# Patient Record
Sex: Male | Born: 1995 | Race: Black or African American | Hispanic: No | Marital: Single | Smoking: Current some day smoker
Health system: Southern US, Community
[De-identification: ages and names within clinical notes are randomized; demographics above are authoritative.]

---

## 2017-04-15 ENCOUNTER — Emergency Department (HOSPITAL_COMMUNITY): Payer: Self-pay

## 2017-04-15 ENCOUNTER — Encounter (HOSPITAL_COMMUNITY): Payer: Self-pay

## 2017-04-15 ENCOUNTER — Emergency Department (HOSPITAL_COMMUNITY)
Admission: EM | Admit: 2017-04-15 | Discharge: 2017-04-15 | Disposition: A | Discharge status: Home / Self Care | Payer: Self-pay | Attending: Emergency Medicine | Admitting: Emergency Medicine

## 2017-04-15 DIAGNOSIS — F17200 Nicotine dependence, unspecified, uncomplicated: Secondary | ICD-10-CM | POA: Insufficient documentation

## 2017-04-15 DIAGNOSIS — M25562 Pain in left knee: Secondary | ICD-10-CM

## 2017-04-15 NOTE — ED Triage Notes (Signed)
Pt complaining of pain in back of L knee. Pt denies any injury/trauma. Pt denies any swelling. Pt ambulatory at triage.

## 2017-04-15 NOTE — ED Provider Notes (Signed)
[facility]-EMERGENCY DEPT Provider Note   CSN: [id number] Arrival date & time: 04/15/17  0137  By signing my name below, I, Daniel Hahn, attest that this documentation has been prepared under the direction and in the presence of Daniel Booze, MD. Electronically Signed: Cynda Hahn, Scribe. 04/15/17. 3:28 AM.  History   Chief Complaint Chief Complaint  Patient presents with  . Knee Pain    HPI Comments: Daniel Hahn is a 21 y.o. male with no pertinent past medical history, who presents to the Emergency Department complaining of sudden-onset, constant left knee pain that began earlier 8 days ago. Patient states he woke up with pain in his left leg 8 days ago. Patient has a knee brace applied in the room, he states he feels "something moving" in his knee when he takes the brace off. Patient reports starting two new jobs. Patient denies any injury or trauma to the area. Patient states his pain is worse with bending, nothing improves his pain. Patient is ambulatory in the emergency department. Patient denies any numbness, weakness, fever, or chills.   The history is provided by the patient. No language interpreter was used.    History reviewed. No pertinent past medical history.  There are no active problems to display for this patient.   History reviewed. No pertinent surgical history.     Home Medications    Prior to Admission medications   Not on File    Family History History reviewed. No pertinent family history.  Social History Social History  Substance Use Topics  . Smoking status: Current Some Day Smoker  . Smokeless tobacco: Never Used  . Alcohol use Yes     Allergies   Patient has no allergy information on record.   Review of Systems Review of Systems  Constitutional: Negative for chills and fever.  Musculoskeletal: Positive for arthralgias (left knee). Negative for gait problem and joint swelling.  Neurological: Negative for weakness and numbness.  All  other systems reviewed and are negative.    Physical Exam Updated Vital Signs BP (!) 130/102   Pulse 81   Temp 98.4 F (36.9 C) (Oral)   Resp 16   SpO2 100%   Physical Exam  Constitutional: He is oriented to person, place, and time. He appears well-developed and well-nourished.  HENT:  Head: Normocephalic and atraumatic.  Eyes: EOM are normal. Pupils are equal, round, and reactive to light.  Neck: Normal range of motion. Neck supple. No JVD present.  Cardiovascular: Normal rate, regular rhythm and normal heart sounds.   No murmur heard. Pulmonary/Chest: Effort normal and breath sounds normal. He has no wheezes. He has no rales. He exhibits no tenderness.  Abdominal: Soft. Bowel sounds are normal. He exhibits no distension and no mass. There is no tenderness.  Musculoskeletal: Normal range of motion. He exhibits no edema, tenderness or deformity.  No instability, swelling, or effusion in the left knee. 1 cm nodule present on the posterior aspect of the knee medially. Mild pain at the extremes with range of motion. Lachman and McMurray's tests are negative.   Lymphadenopathy:    He has no cervical adenopathy.  Neurological: He is alert and oriented to person, place, and time. No cranial nerve deficit. He exhibits normal muscle tone. Coordination normal.  Skin: Skin is warm and dry. No rash noted.  Psychiatric: He has a normal mood and affect. His behavior is normal. Judgment and thought content normal.  Nursing note and vitals reviewed.    ED Treatments / Results  DIAGNOSTIC STUDIES: Oxygen Saturation is 100% on RA, normal by my interpretation.    COORDINATION OF CARE: 3:27 AM Discussed treatment plan with pt at bedside and pt agreed to plan, which includes an x-ray, naproxen, and an orthopedic follow-up.    Radiology Dg Knee Complete 4 Views Left  Result Date: 04/15/2017 CLINICAL DATA:  Left knee pain x1 week worsening this evening. EXAM: LEFT KNEE - COMPLETE 4+ VIEW  COMPARISON:  None. FINDINGS: No evidence of fracture, dislocation, or joint effusion. No evidence of arthropathy or other focal bone abnormality. Soft tissues are unremarkable. IMPRESSION: No acute fracture, malalignment or joint effusion of the left knee. Electronically Signed   By: Tollie Ethavid  Kwon M.D.   On: 04/15/2017 03:52    Procedures Procedures (including critical care time)  Medications Ordered in ED Medications - No data to display   Initial Impression / Assessment and Plan / ED Course  I have reviewed the triage vital signs and the nursing notes.  Pertinent imaging results that were available during my care of the patient were reviewed by me and considered in my medical decision making (see chart for details).  Left knee pain. Symptoms and exam are suspicious for meniscus tear. On tender nodule adjacent to the hamstring tendon is of uncertain cause. He is sent for x-rays which show no acute injury. He is advised to try ice and continue using his knee brace. He is referred to orthopedics for follow-up. Advised to use over-the-counter naproxen.  Final Clinical Impressions(s) / ED Diagnoses   Final diagnoses:  Acute pain of left knee    New Prescriptions New Prescriptions   No medications on file   I personally performed the services described in this documentation, which was scribed in my presence. The recorded information has been reviewed and is accurate.       Daniel BoozeGlick, Tangy Drozdowski, MD 04/15/17 [phone]

## 2017-04-15 NOTE — Discharge Instructions (Signed)
Wear your knee brace as needed. Apply ice several times a day. Take naproxen (Aleve) - two tablets at a time, twice a day.

## 2018-11-17 IMAGING — CR DG KNEE COMPLETE 4+V*L*
4 series · 4 of 4 positions shown · non-contrast
Comparison: None.

CLINICAL DATA: Left knee pain x1 week worsening this evening.

EXAM:
LEFT KNEE - COMPLETE 4+ VIEW

[knee ap]
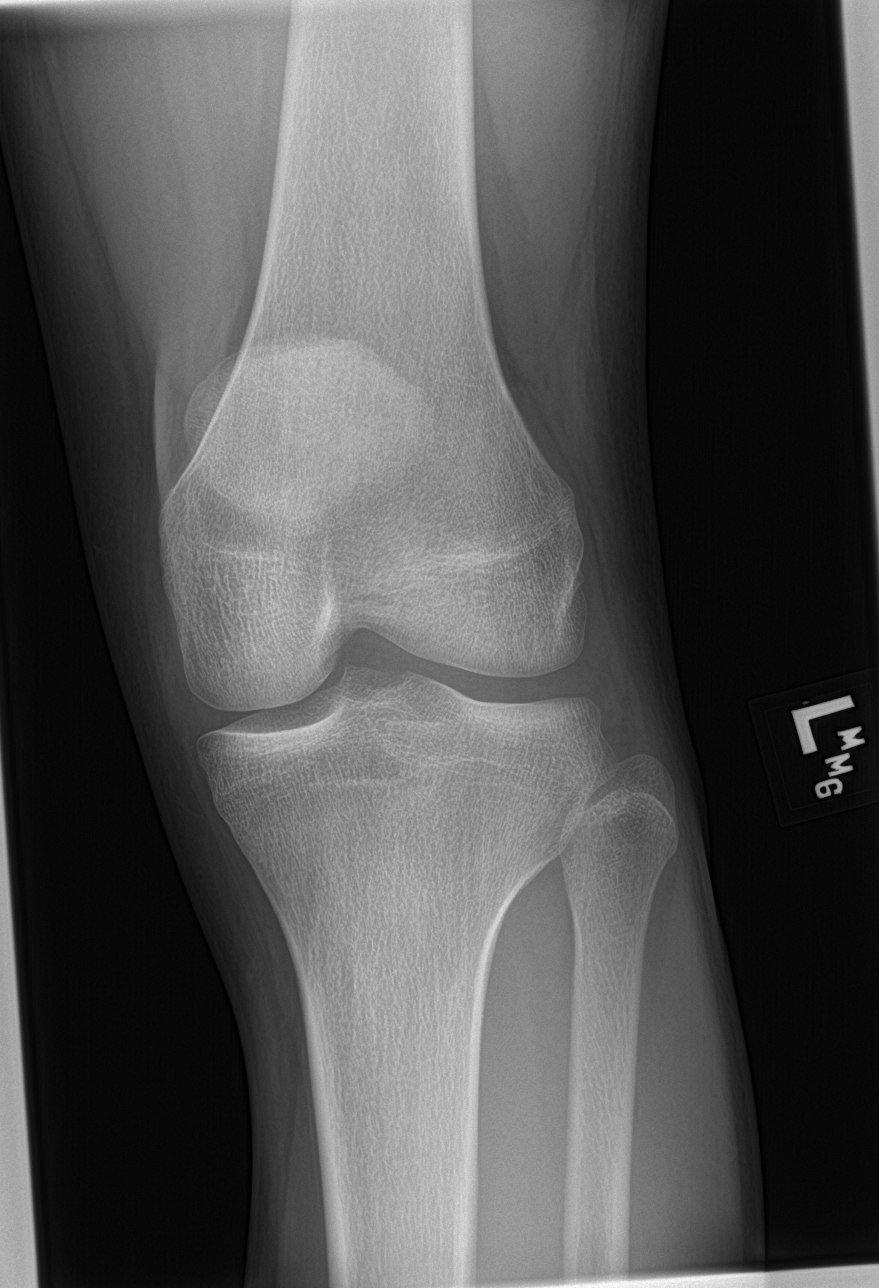

[knee lat]
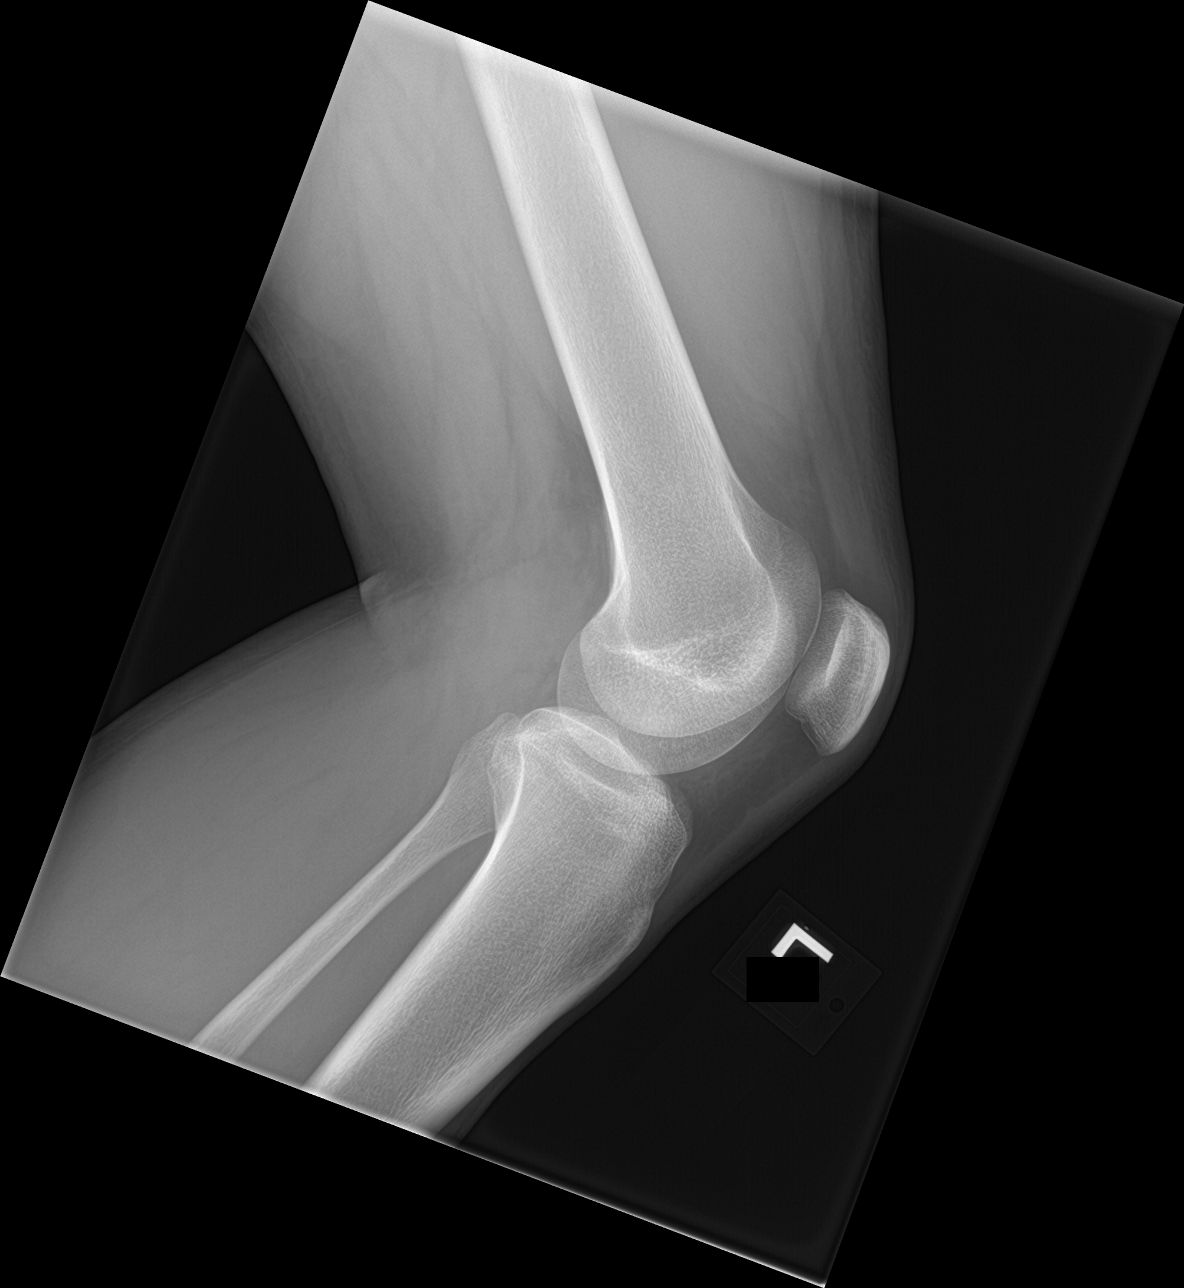

[knee obl (1 of 2)]
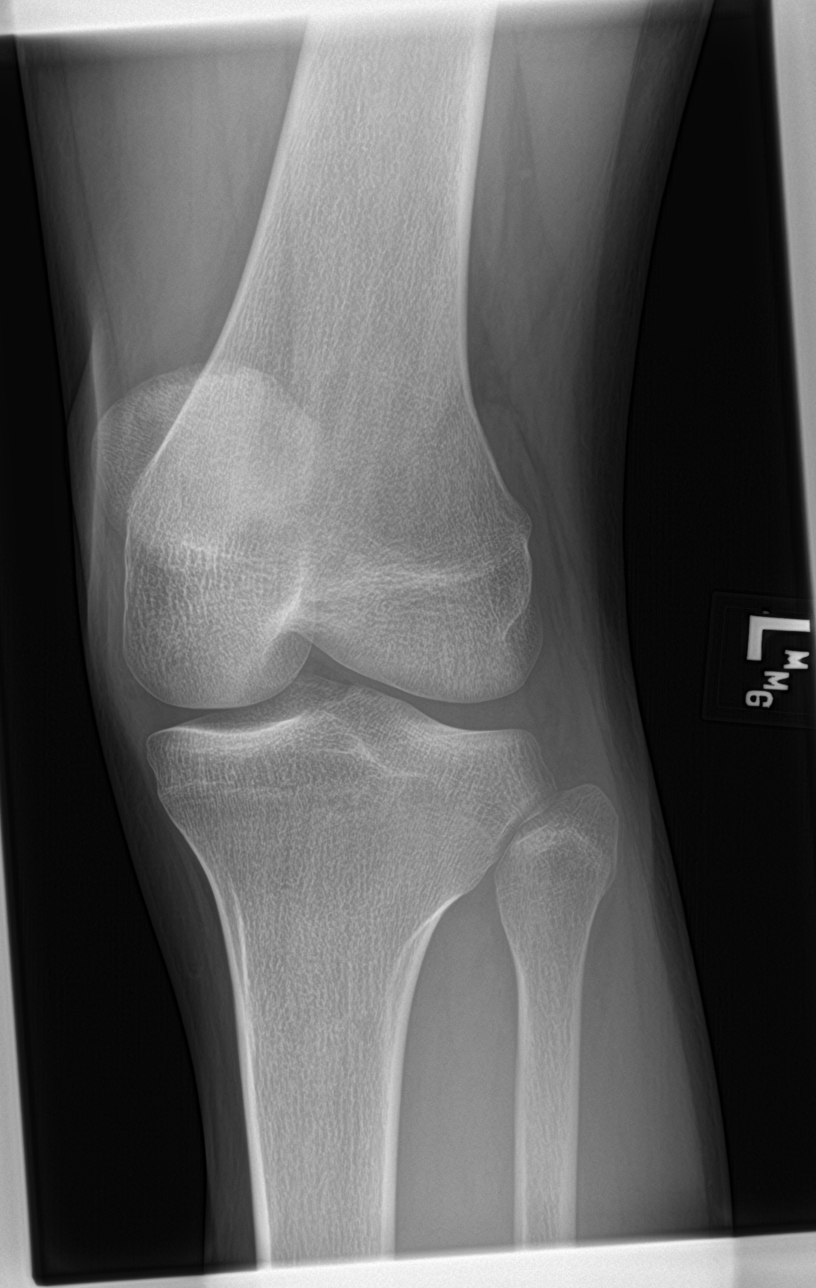

[knee obl (2 of 2)]
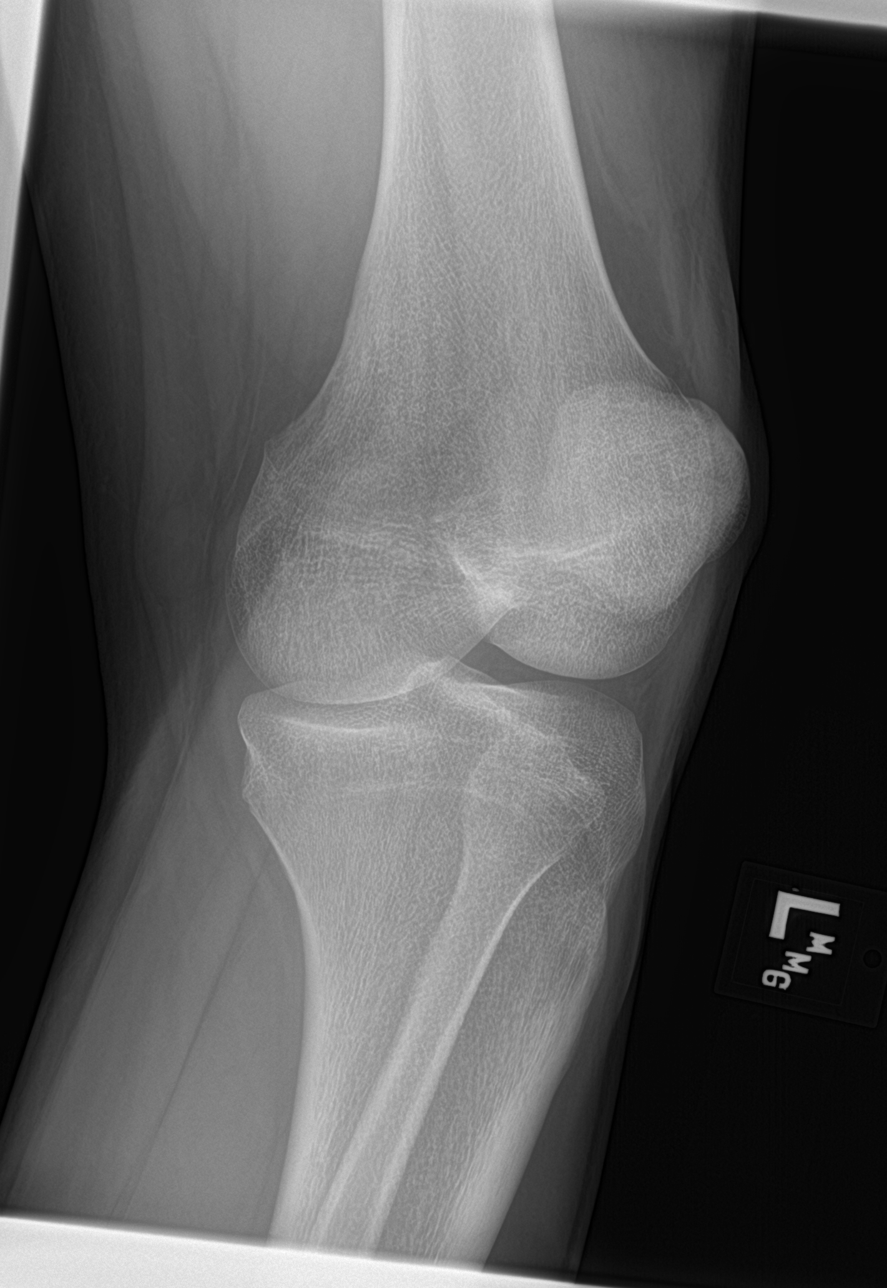

[4 of 4 positions shown; findings below may reference images not displayed]

FINDINGS: No evidence of fracture, dislocation, or joint effusion. No evidence
of arthropathy or other focal bone abnormality. Soft tissues are
unremarkable.
IMPRESSION: No acute fracture, malalignment or joint effusion of the left knee.
